# Patient Record
Sex: Female | Born: 2000 | Race: White | Hispanic: No | Marital: Single | Smoking: Never smoker
Health system: Southern US, Community
[De-identification: ages and names within clinical notes are randomized; demographics above are authoritative.]

## PROBLEM LIST (undated history)

## (undated) HISTORY — PX: URETEROURETEROSTOMY / TRANSURETEROURETEROSTOMY: SUR1414

---
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
## 2019-02-08 IMAGING — CR DG FINGER INDEX 2+V*L*
3 series · 3 of 3 positions shown · non-contrast
Comparison: None.

CLINICAL DATA: 15-year-old female with laceration of the index
finger.

EXAM:
LEFT INDEX FINGER 2+V

[finger ap]
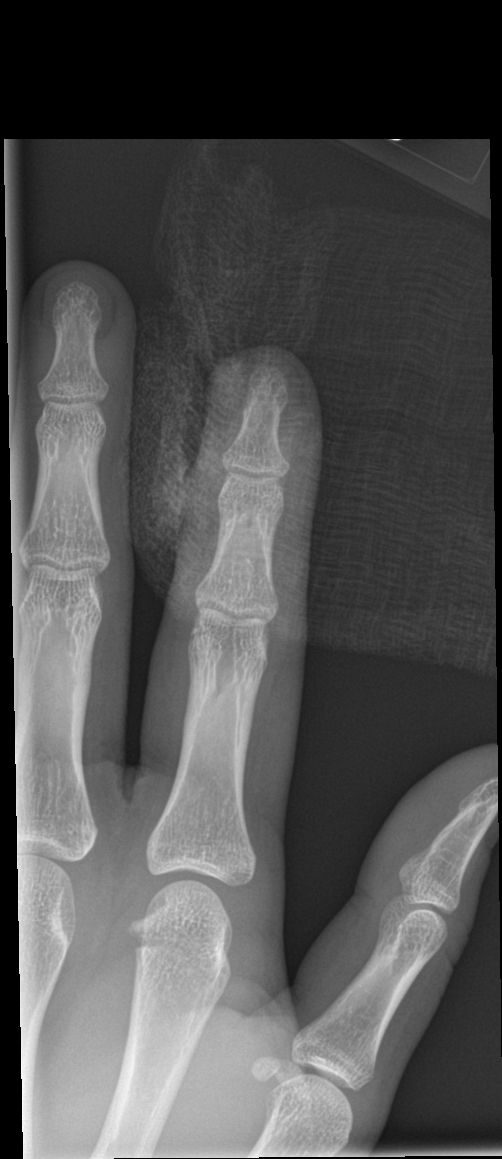

[finger obl]
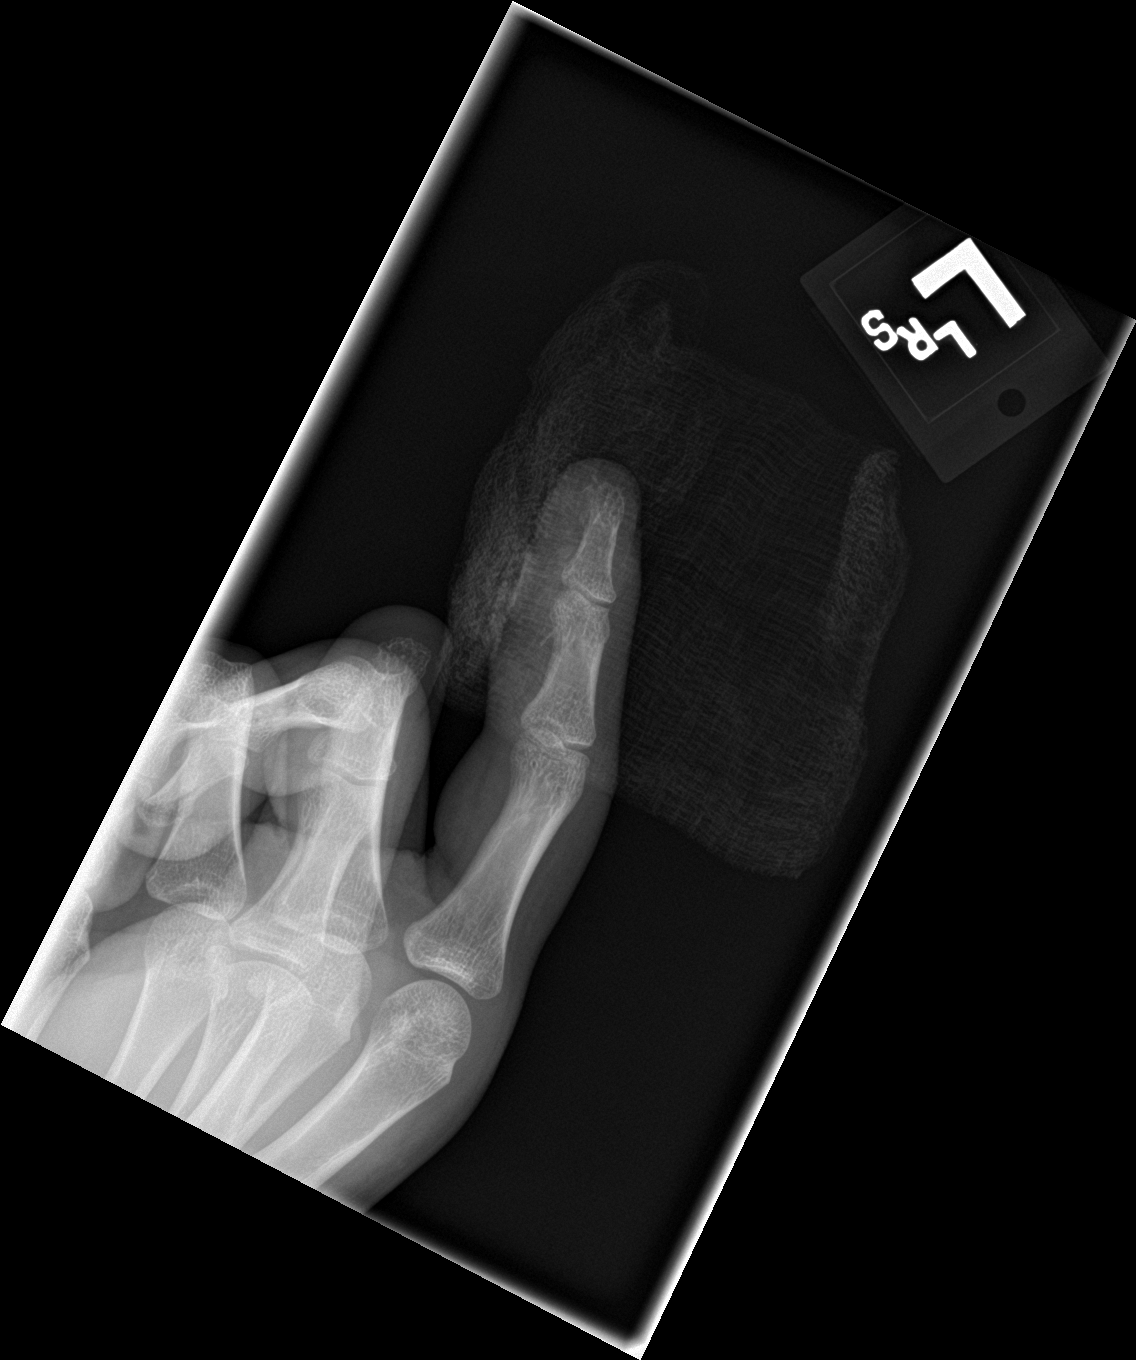

[finger lat]
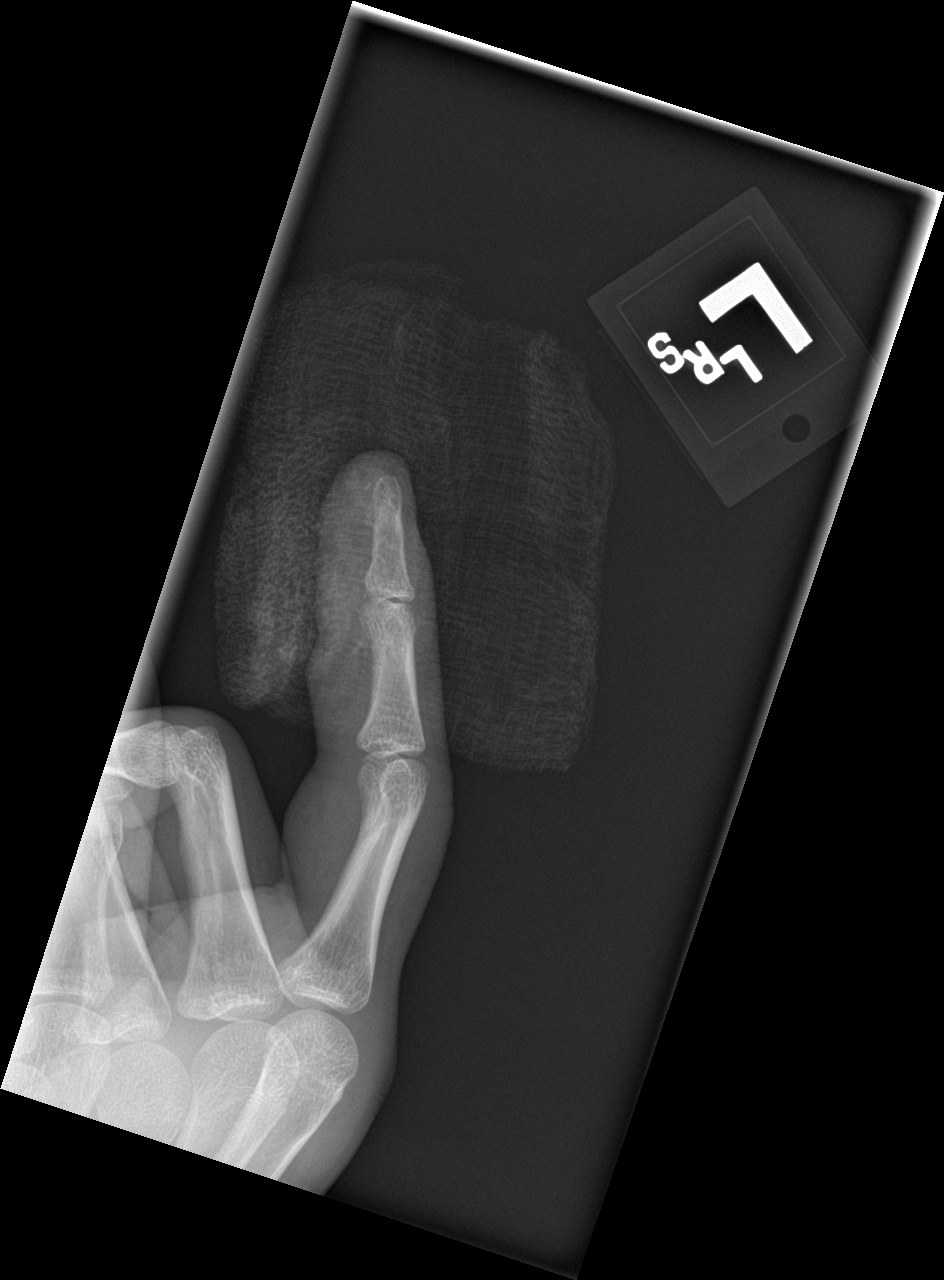

[3 of 3 positions shown; findings below may reference images not displayed]

FINDINGS: There is a tiny radiodense fragment adjacent to the tuft of the
distal phalanx of the index finger likely representing a small
fracture fragment of the tuft. A thin linear fragment is also noted
adjacent to the cortex of the middle phalanx of the index finger.
Evaluation of the bones is somewhat limited due to overlying gauze.
There is no dislocation. The bones are well mineralized. No
arthritic changes. There is a laceration of the soft tissues of the
distal aspect of the finger with diffuse soft tissue swelling. No
radiopaque foreign object.
IMPRESSION: Tiny fracture of the tuft of the distal phalanx of the index finger.
A thin linear fragment along the volar surface of the middle phalanx
of the digit may also represent a tiny cortical fracture.

## 2019-05-07 ENCOUNTER — Telehealth: Payer: Self-pay | Admitting: Pediatrics

## 2019-05-07 DIAGNOSIS — Z20822 Contact with and (suspected) exposure to covid-19: Secondary | ICD-10-CM

## 2019-05-07 NOTE — Telephone Encounter (Signed)
Pt scheduled for testing at [facility] site tomorrow. Requested by Dr. Einar Gip At [facility].  Pt with possible exposure  Testing process reviewed, wear mask, stay in car; pt verbalizes understanding.  Pts CB# [place] # [phone] FAX [phone]

## 2019-05-08 ENCOUNTER — Other Ambulatory Visit: Payer: Commercial Managed Care - PPO

## 2019-05-08 DIAGNOSIS — Z20822 Contact with and (suspected) exposure to covid-19: Secondary | ICD-10-CM

## 2019-05-13 LAB — NOVEL CORONAVIRUS, NAA: SARS-CoV-2, NAA: NOT DETECTED

## 2019-05-18 ENCOUNTER — Telehealth: Payer: Self-pay | Admitting: Pediatrics

## 2019-05-18 NOTE — Telephone Encounter (Signed)
Pt given(not detected) covid-19 results/ Pt verbalized understanding

## 2020-04-25 ENCOUNTER — Encounter (HOSPITAL_COMMUNITY): Payer: Self-pay

## 2020-04-25 ENCOUNTER — Emergency Department (HOSPITAL_COMMUNITY)
Admission: EM | Admit: 2020-04-25 | Discharge: 2020-04-26 | Disposition: A | Discharge status: Home / Self Care | Payer: Commercial Managed Care - PPO | Attending: Emergency Medicine | Admitting: Emergency Medicine

## 2020-04-25 DIAGNOSIS — Z5321 Procedure and treatment not carried out due to patient leaving prior to being seen by health care provider: Secondary | ICD-10-CM | POA: Diagnosis not present

## 2020-04-25 DIAGNOSIS — R109 Unspecified abdominal pain: Secondary | ICD-10-CM | POA: Diagnosis not present

## 2020-04-25 NOTE — ED Triage Notes (Signed)
Pt reports that she had her IUD removed and a new one inserted last week and since has been having pelvic pain that radiates to her back. Reports constant discharge.

## 2020-04-26 LAB — URINALYSIS, ROUTINE W REFLEX MICROSCOPIC
Bilirubin Urine: NEGATIVE
Glucose, UA: NEGATIVE mg/dL
Ketones, ur: NEGATIVE mg/dL
Nitrite: NEGATIVE
Protein, ur: 30 mg/dL — AB
Specific Gravity, Urine: 1.026 (ref 1.005–1.030)
pH: 5 (ref 5.0–8.0)

## 2020-04-26 LAB — I-STAT BETA HCG BLOOD, ED (MC, WL, AP ONLY): I-stat hCG, quantitative: <5 m[IU]/mL (ref <5)

## 2020-04-26 MED ORDER — IBUPROFEN 400 MG PO TABS
400.0000 mg | ORAL_TABLET | Freq: Once | ORAL | Status: AC | PRN
  Administered 2020-04-26: 400 mg via ORAL
  Filled 2020-04-26: qty 1

## 2020-04-26 NOTE — ED Notes (Signed)
Pt dropped stickers with registration and left

## 2021-06-15 ENCOUNTER — Emergency Department (HOSPITAL_BASED_OUTPATIENT_CLINIC_OR_DEPARTMENT_OTHER)
Admission: EM | Admit: 2021-06-15 | Discharge: 2021-06-15 | Disposition: A | Discharge status: Home / Self Care | Payer: Commercial Managed Care - PPO | Attending: Emergency Medicine | Admitting: Emergency Medicine

## 2021-06-15 ENCOUNTER — Other Ambulatory Visit: Payer: Self-pay

## 2021-06-15 ENCOUNTER — Emergency Department (HOSPITAL_BASED_OUTPATIENT_CLINIC_OR_DEPARTMENT_OTHER): Payer: Commercial Managed Care - PPO

## 2021-06-15 ENCOUNTER — Encounter (HOSPITAL_BASED_OUTPATIENT_CLINIC_OR_DEPARTMENT_OTHER): Payer: Self-pay | Admitting: Obstetrics and Gynecology

## 2021-06-15 DIAGNOSIS — U071 COVID-19: Secondary | ICD-10-CM | POA: Diagnosis not present

## 2021-06-15 DIAGNOSIS — J029 Acute pharyngitis, unspecified: Secondary | ICD-10-CM | POA: Diagnosis present

## 2021-06-15 MED ORDER — IBUPROFEN 400 MG PO TABS
600.0000 mg | ORAL_TABLET | Freq: Once | ORAL | Status: AC
  Administered 2021-06-15: 600 mg via ORAL
  Filled 2021-06-15: qty 1

## 2021-06-15 NOTE — ED Triage Notes (Signed)
Patient reports to the ER for being COVID + since Saturday. Patient reports she started Paxlovid yesterday. Patient reports she feels like her ears and throat hurt.

## 2021-06-15 NOTE — ED Provider Notes (Signed)
[place] EMERGENCY DEPT Provider Note   CSN: [id number] Arrival date & time: 06/15/21  1754     History Chief Complaint  Patient presents with   Covid Positive   Sore Throat    Sarah Cline is a 20 y.o. female.  She is positive for COVID-19 and has been started on Paxlovid.  She has had 1 dose of the medication.  She is here complaining of shortness of breath, sore throat, and ear pain.  She has not taken any medication because she was told that she did not need to take anything with the Paxlovid.  She is vaccinated and boosted for COVID-19.  The history is provided by the patient.  Sore Throat This is a new problem. Episode onset: 4-5 days ago. The problem occurs constantly. The problem has been gradually worsening. Associated symptoms include shortness of breath. Pertinent negatives include no chest pain, no abdominal pain and no headaches. Nothing aggravates the symptoms. Nothing relieves the symptoms. She has tried nothing for the symptoms. The treatment provided no relief.      History reviewed. No pertinent past medical history.  Patient Active Problem List   Diagnosis Date Noted   Status post incision and drainage 05/29/2017   Laceration of left hand without complication, including fingers 05/29/2017    Past Surgical History:  Procedure Laterality Date   I & D EXTREMITY Left 05/28/2017   Procedure: IRRIGATION AND DEBRIDEMENT LEFT HAND INDEX FINGER REPAIR, NERVE, TENDON REPAIR AND NAIL BED REPAIR WITH SPLINT APPLICATION;  Surgeon: Roseanne Kaufman, MD;  Location: [facility];  Service: Orthopedics;  Laterality: Left;   URETEROURETEROSTOMY / TRANSURETEROURETEROSTOMY       OB History     Gravida  0   Para  0   Term  0   Preterm  0   AB  0   Living  0      SAB  0   IAB  0   Ectopic  0   Multiple  0   Live Births  0           No family history on file.  Social History   Tobacco Use   Smoking status: Never   Smokeless tobacco:  Never  Vaping Use   Vaping Use: Never used  Substance Use Topics   Alcohol use: No   Drug use: Yes    Types: Marijuana    Home Medications Prior to Admission medications   Not on File    Allergies    Cyclobenzaprine and Prednisone  Review of Systems   Review of Systems  Constitutional:  Negative for chills and fever.  HENT:  Positive for ear pain and sore throat.   Eyes:  Negative for pain and visual disturbance.  Respiratory:  Positive for cough and shortness of breath.   Cardiovascular:  Negative for chest pain and palpitations.  Gastrointestinal:  Negative for abdominal pain and vomiting.  Genitourinary:  Negative for dysuria and hematuria.  Musculoskeletal:  Negative for arthralgias and back pain.  Skin:  Negative for color change and rash.  Neurological:  Negative for seizures, syncope and headaches.  All other systems reviewed and are negative.  Physical Exam Updated Vital Signs BP (!) 154/92 (BP Location: Right Arm)   Pulse 91   Temp 99.8 F (37.7 C) (Oral)   Resp 16   Ht '5\' 4"'$  (1.626 m)   Wt 86.2 kg   LMP 06/01/2021 (Approximate)   SpO2 100%   BMI 32.61 kg/m  Physical Exam Vitals and nursing note reviewed.  Constitutional:      Appearance: She is well-developed.  HENT:     Head: Normocephalic and atraumatic.     Right Ear: Tympanic membrane and ear canal normal.     Left Ear: Tympanic membrane and ear canal normal.     Mouth/Throat:     Mouth: Mucous membranes are moist.     Pharynx: Uvula midline. Posterior oropharyngeal erythema present. No pharyngeal swelling or uvula swelling.  Cardiovascular:     Rate and Rhythm: Normal rate and regular rhythm.     Heart sounds: Normal heart sounds.  Pulmonary:     Effort: Pulmonary effort is normal. No tachypnea.     Breath sounds: Normal breath sounds.  Abdominal:     Palpations: Abdomen is soft.     Tenderness: There is no abdominal tenderness.  Musculoskeletal:     Cervical back: Normal range of  motion.     Right lower leg: No edema.     Left lower leg: No edema.  Skin:    General: Skin is warm and dry.  Neurological:     General: No focal deficit present.     Mental Status: She is alert and oriented to person, place, and time.  Psychiatric:        Mood and Affect: Mood normal.        Behavior: Behavior normal.    ED Results / Procedures / Treatments   Labs (all labs ordered are listed, but only abnormal results are displayed) Labs Reviewed - No data to display  EKG None  Radiology DG Chest Portable 1 View  Result Date: 06/15/2021 CLINICAL DATA:  COVID.  Cough and fever. EXAM: PORTABLE CHEST 1 VIEW COMPARISON:  None. FINDINGS: Lung volumes are low.The cardiomediastinal contours are normal. Pulmonary vasculature is normal. No consolidation, pleural effusion, or pneumothorax. No acute osseous abnormalities are seen. IMPRESSION: Low lung volumes without acute abnormality. Electronically Signed   By: Keith Rake M.D.   On: 06/15/2021 18:49    Procedures Procedures   Medications Ordered in ED Medications  ibuprofen (ADVIL) tablet 600 mg (has no administration in time range)    ED Course  I have reviewed the triage vital signs and the nursing notes.  Pertinent labs & imaging results that were available during my care of the patient were reviewed by me and considered in my medical decision making (see chart for details).    MDM Rules/Calculators/A&P                           Park Liter presents with COVID-19.  She is well-appearing with the exception of mild hypertension.  She was reassured that she is at very low risk for complications.  She has been fully vaccinated and is on Paxlovid.  Chest x-ray reassuring.  She was advised on symptomatic management for her sore throat.  I told her it is just fine to take ibuprofen and/or Tylenol.  She declined viscous lidocaine. Final Clinical Impression(s) / ED Diagnoses Final diagnoses:  [id number]    Rx / DC  Orders ED Discharge Orders     None        Arnaldo Natal, MD 06/15/21 1859

## 2021-06-15 NOTE — ED Notes (Signed)
This RN presented the AVS utilizing Teachback Method. Patient verbalizes understanding of Discharge Instructions. Opportunity for Questioning and Answers were provided. Patient Discharged from ED ambulatory to Home via SELF
